# Patient Record
Sex: Female | Born: 1965 | Race: White | Hispanic: No | Marital: Single
Health system: Southern US, Community
[De-identification: ages and names within clinical notes are randomized; demographics above are authoritative.]

---

## 2019-06-11 ENCOUNTER — Other Ambulatory Visit: Payer: Self-pay | Admitting: Family Medicine

## 2019-06-11 DIAGNOSIS — R928 Other abnormal and inconclusive findings on diagnostic imaging of breast: Secondary | ICD-10-CM

## 2019-06-12 ENCOUNTER — Ambulatory Visit
Admission: RE | Admit: 2019-06-12 | Discharge: 2019-06-12 | Disposition: A | Discharge status: Home / Self Care | Payer: 59 | Source: Ambulatory Visit | Attending: Family Medicine | Admitting: Family Medicine

## 2019-06-12 ENCOUNTER — Encounter: Payer: Self-pay | Admitting: Radiology

## 2019-06-12 DIAGNOSIS — R928 Other abnormal and inconclusive findings on diagnostic imaging of breast: Secondary | ICD-10-CM | POA: Insufficient documentation

## 2019-06-17 NOTE — Progress Notes (Signed)
Per Christianne Dolin FNP, patient will be followed at [facility].  States patient insurance changing, and [employer].  She has an appointment on 06/21/2019.  Provider very grateful for continuity of care patient has received here.

## 2019-06-19 LAB — SURGICAL PATHOLOGY

## 2020-04-20 IMAGING — MG US  BREAST BX W/ LOC DEV 1ST LESION IMG BX SPEC US GUIDE*R*
1 series · 8 of 8 positions shown · non-contrast
Comparison: Previous exam(s).
COMPARISON: Previous exam(s).

Addendum:
CLINICAL DATA: Patient with a RIGHT breast mass presents today for
ultrasound-guided core biopsy.

EXAM:
ULTRASOUND GUIDED RIGHT BREAST CORE NEEDLE BIOPSY

[Series 1: MG view · 0.07mm/px · 8 of 12 slices shown]
[im 1/12]
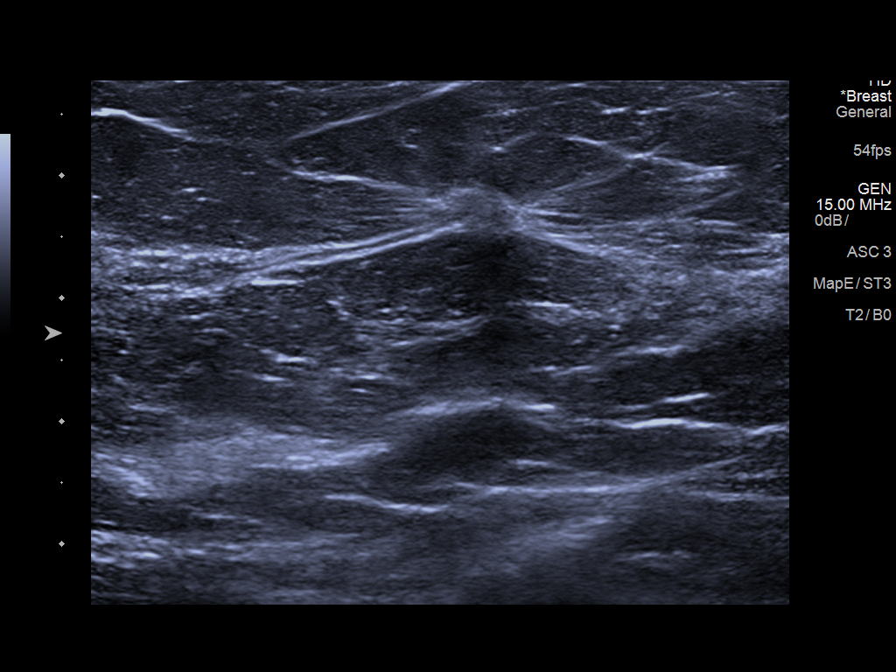
[im 2/12]
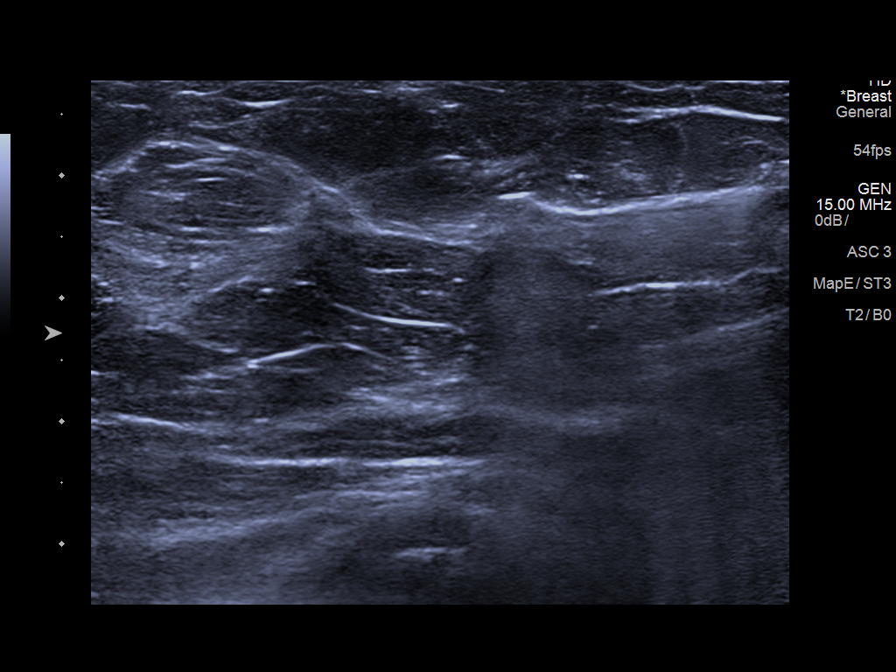
[im 4/12]
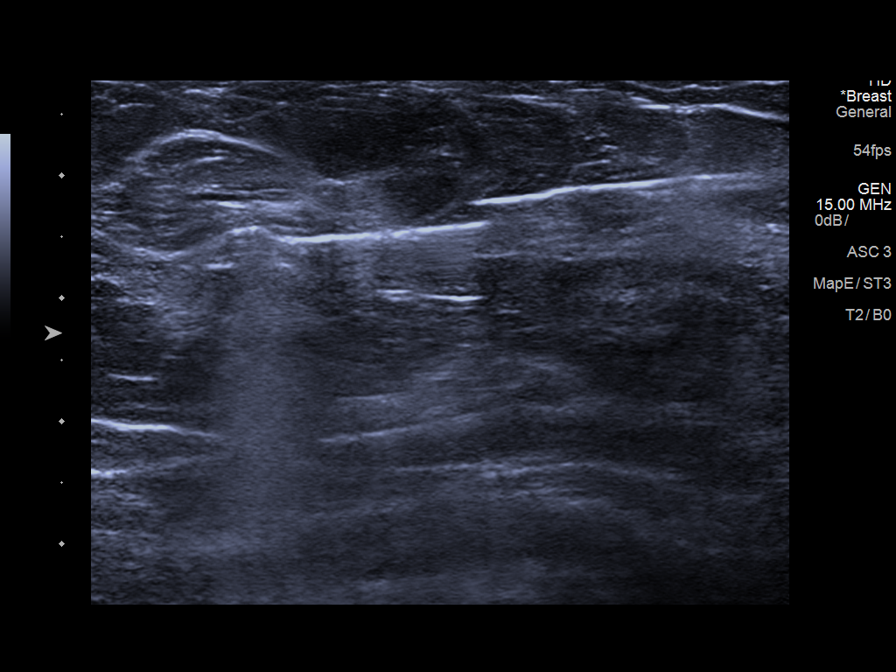
[im 5/12]
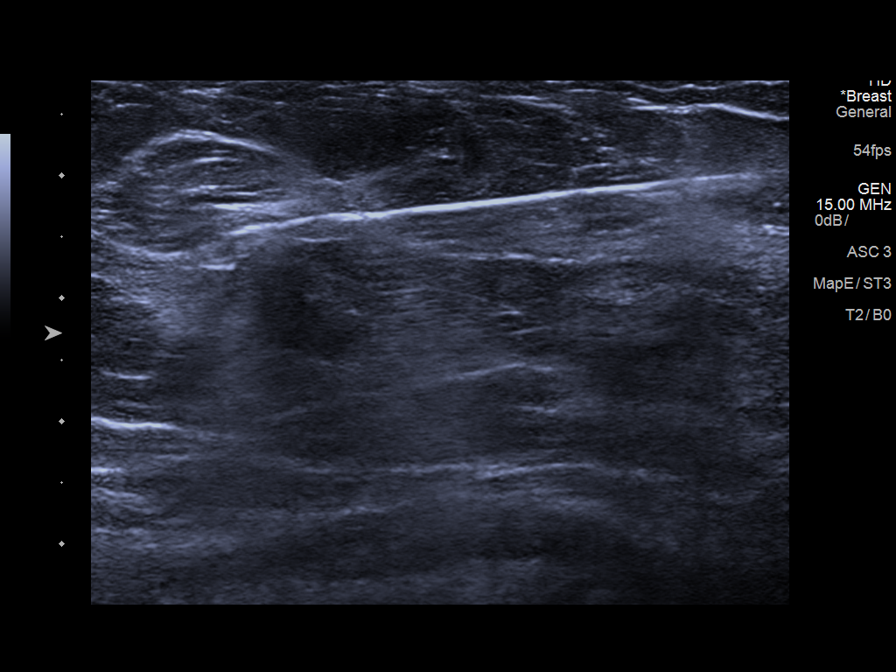
[im 7/12]
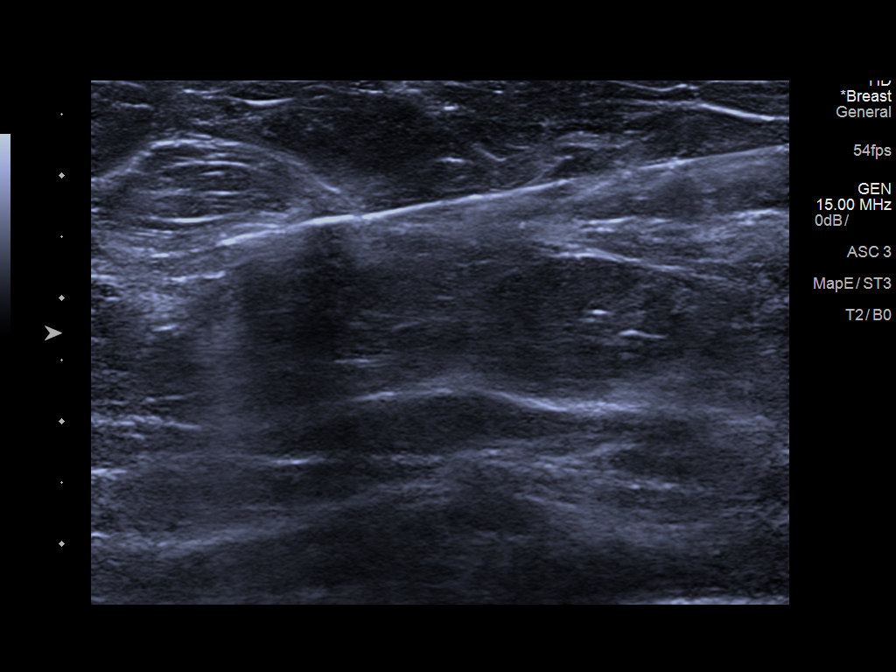
[im 8/12]
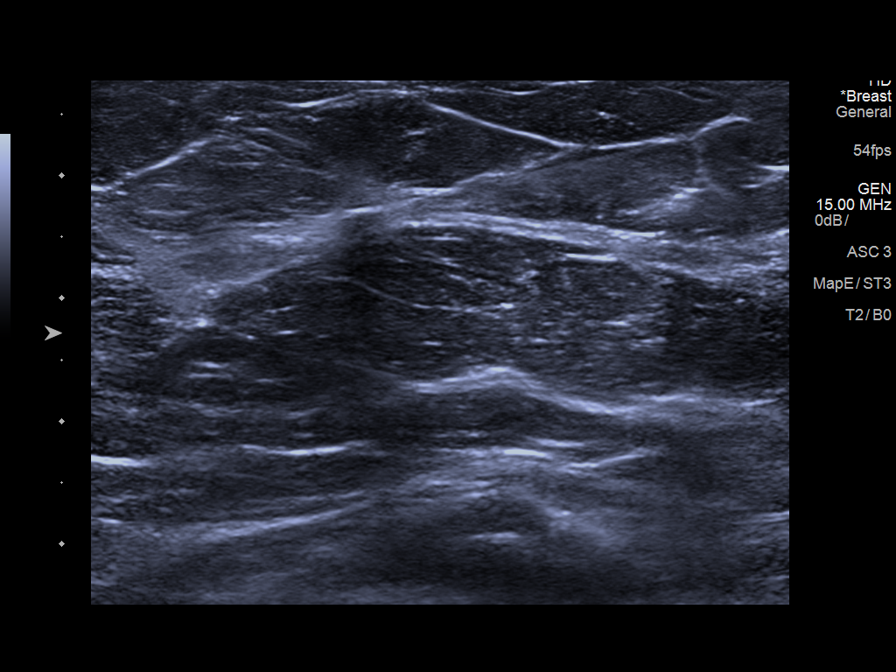
[im 10/12]
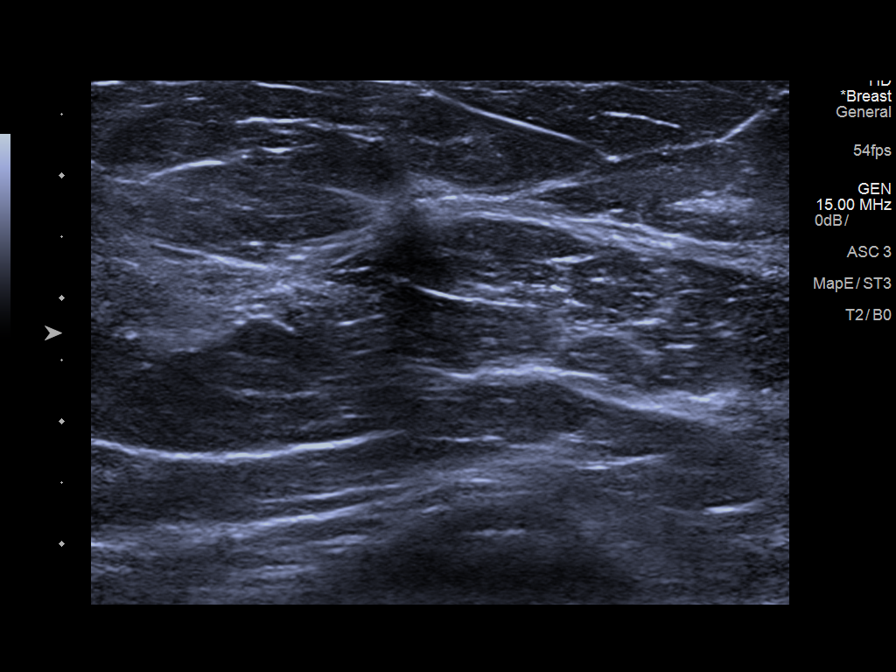
[im 12/12]
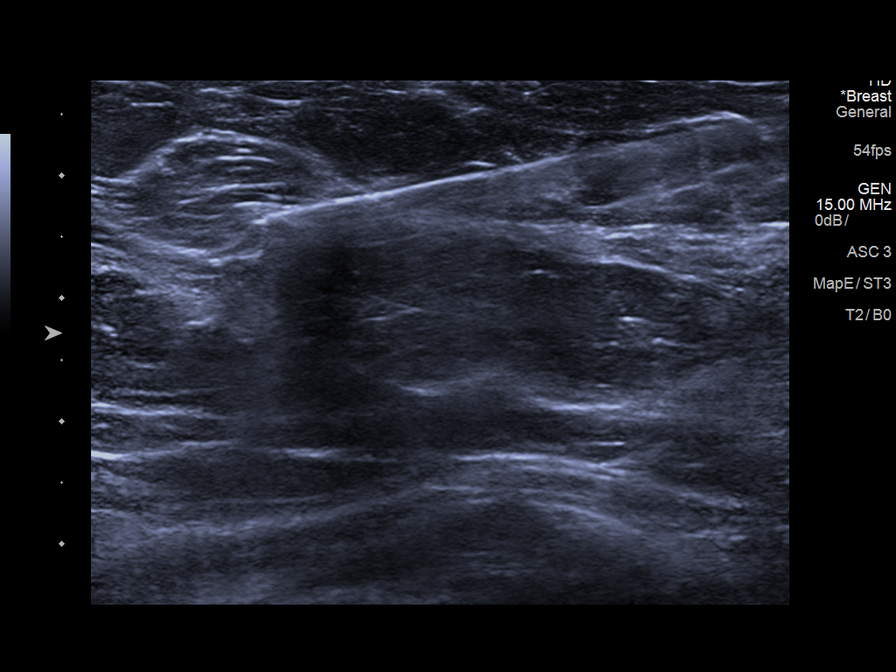

[8 of 8 positions shown; findings below may reference images not displayed]



Lesion quadrant: Upper outer quadrant

Using sterile technique and 1% Lidocaine as local anesthetic, under
direct ultrasound visualization, a 12 gauge Ramalabasov device was
used to perform biopsy of the RIGHT breast mass at the 10 o'clock
axis using a lateral approach. At the conclusion of the procedure
venous shaped tissue marker clip was deployed into the biopsy
cavity. Follow up 2 view mammogram was performed and dictated
separately.
IMPRESSION: Ultrasound guided biopsy of the RIGHT breast mass at the 10 o'clock
axis. No apparent complications.

ADDENDUM:
PATHOLOGY revealed: A. BREAST, RIGHT, [DATE] - INVASIVE MAMMARY
CARCINOMA, NOS, ductal. 6 mm in this sample. Grade 1. Ductal
carcinoma in situ: Not identified. Lymphovascular invasion: Not
identified.

Pathology results are CONCORDANT with imaging findings, per Dr. Sini-Tuuli
Callista.

Pathology results were discussed with patient via telephone. The
patient reported doing well after the biopsy with no adverse
symptoms, only tenderness at the site. Post biopsy care instructions
were reviewed and questions were answered. The patient was
encouraged to call [HOSPITAL] for any additional
questions or concerns. I informed patient her biopsy results will be
sent to Mawladad Salaar NP, and she will contact patient to set up
surgeon appointment.

Recommendation: Surgical referral. Request for surgical referral was
relayed to [place], at Queena Jumper NP office. Queena Jumper NP
(Dr. [[email or web address]]) will discuss surgical referral options with
patient and arrange surgical referral appointment for patient.

Addendum by Tomo RN on 06/14/2019.



Lesion quadrant: Upper outer quadrant

Using sterile technique and 1% Lidocaine as local anesthetic, under
direct ultrasound visualization, a 12 gauge Saki Hinostroza device was
used to perform biopsy of the RIGHT breast mass at the 10 o'clock
axis using a lateral approach. At the conclusion of the procedure
venous shaped tissue marker clip was deployed into the biopsy
cavity. Follow up 2 view mammogram was performed and dictated
separately.
IMPRESSION: Ultrasound guided biopsy of the RIGHT breast mass at the 10 o'clock
axis. No apparent complications.

## 2020-04-20 IMAGING — MG MM BREAST LOCALIZATION CLIP
4 series · 4 of 12 positions shown · non-contrast
Comparison: Previous exam(s).

CLINICAL DATA: Status post ultrasound-guided biopsy of a RIGHT
breast mass at the 10 o'clock axis.

EXAM:
DIAGNOSTIC RIGHT MAMMOGRAM POST ULTRASOUND BIOPSY

[R CC synth-2D]
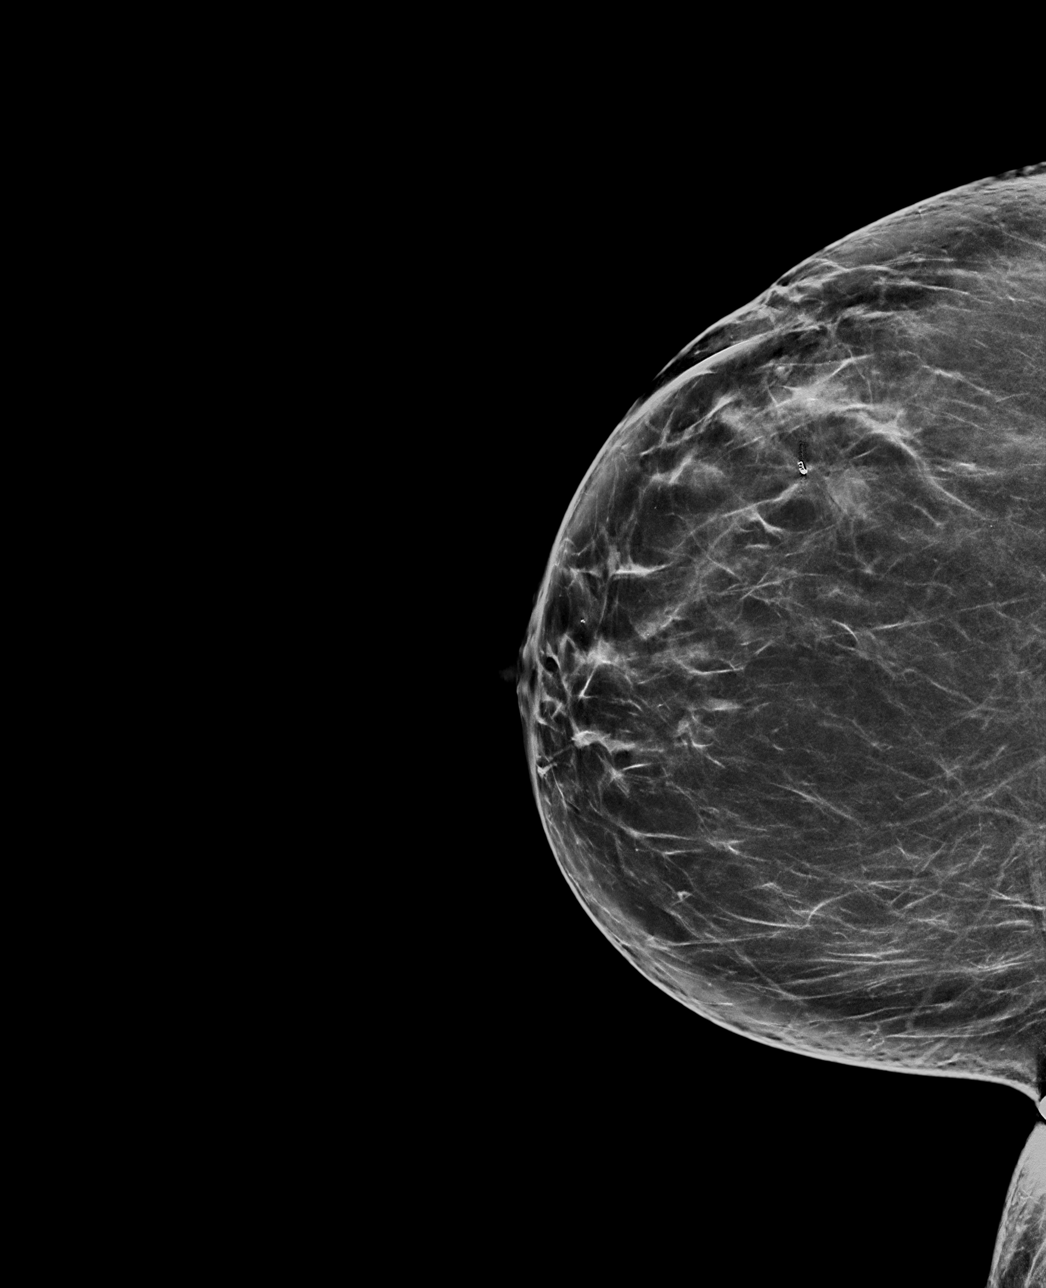

[R ML synth-2D]
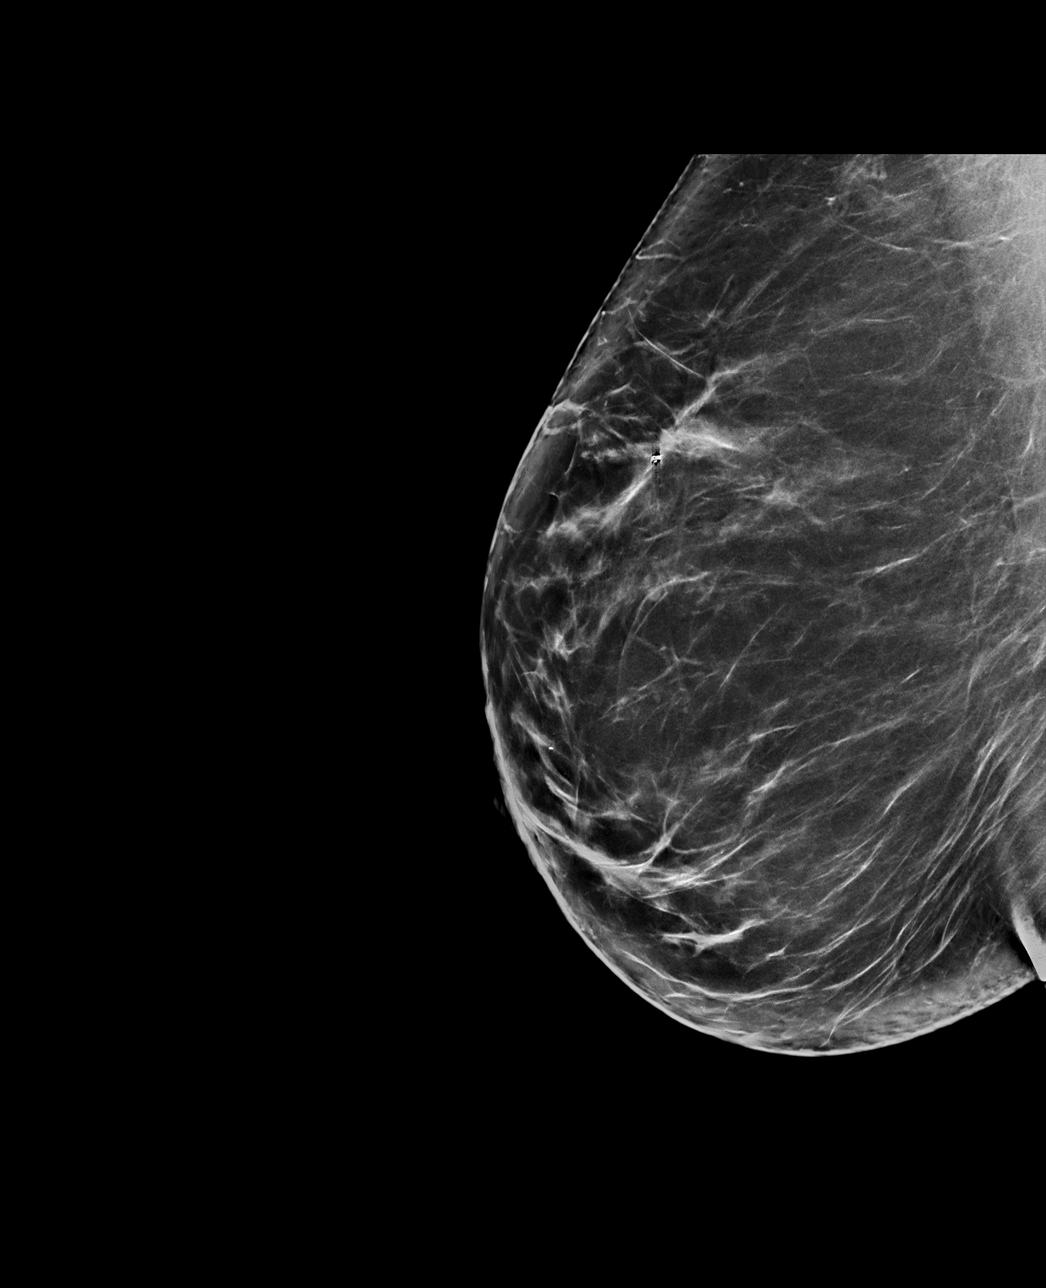

[R CC tomo · tomo slice 41/80.0]
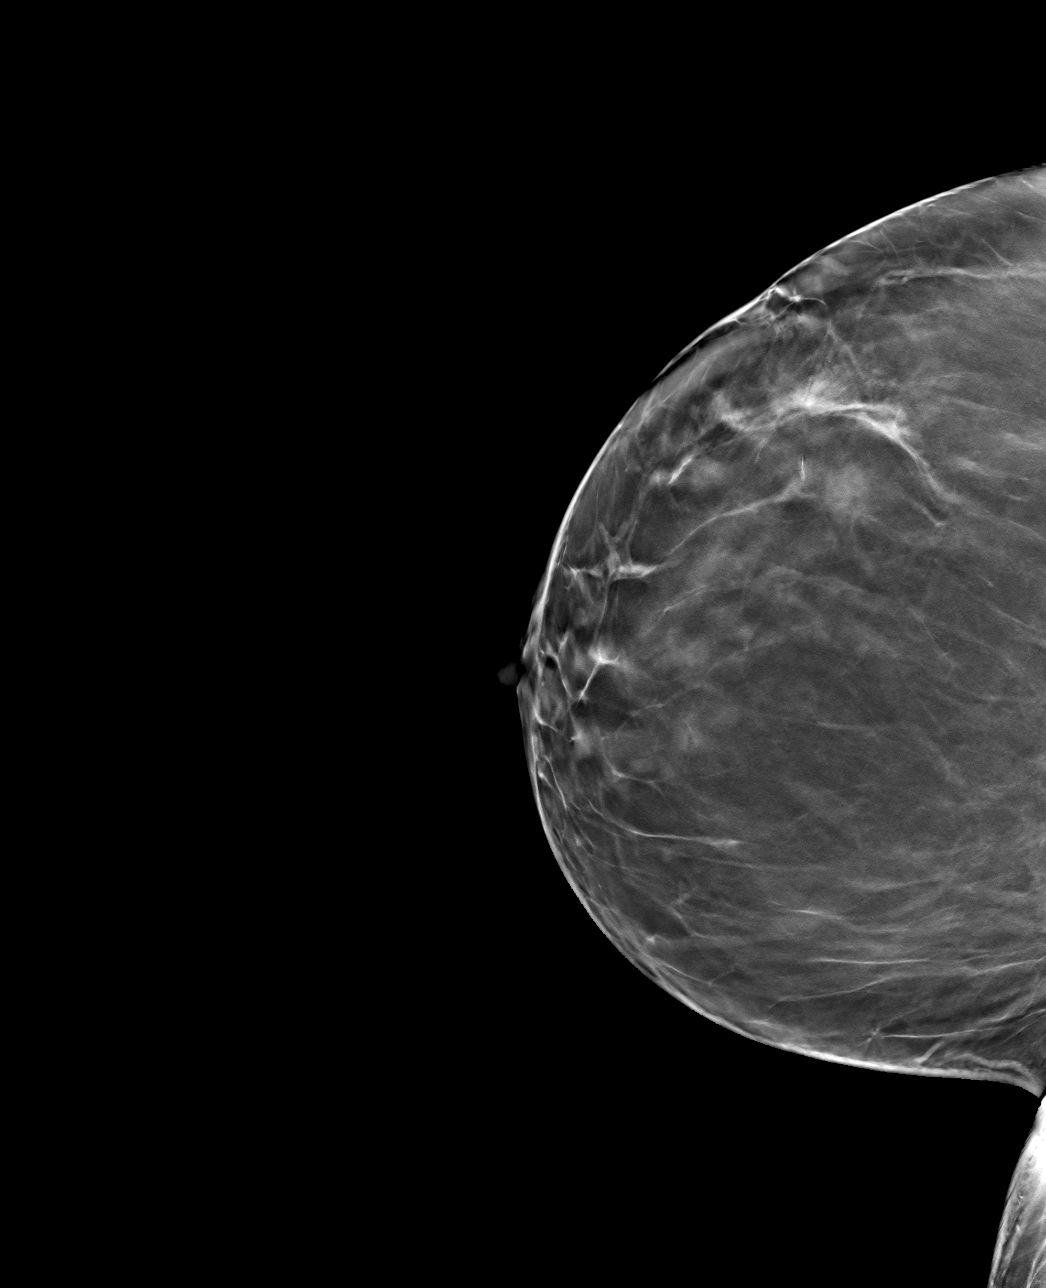

[R ML tomo · tomo slice 44/87.0]
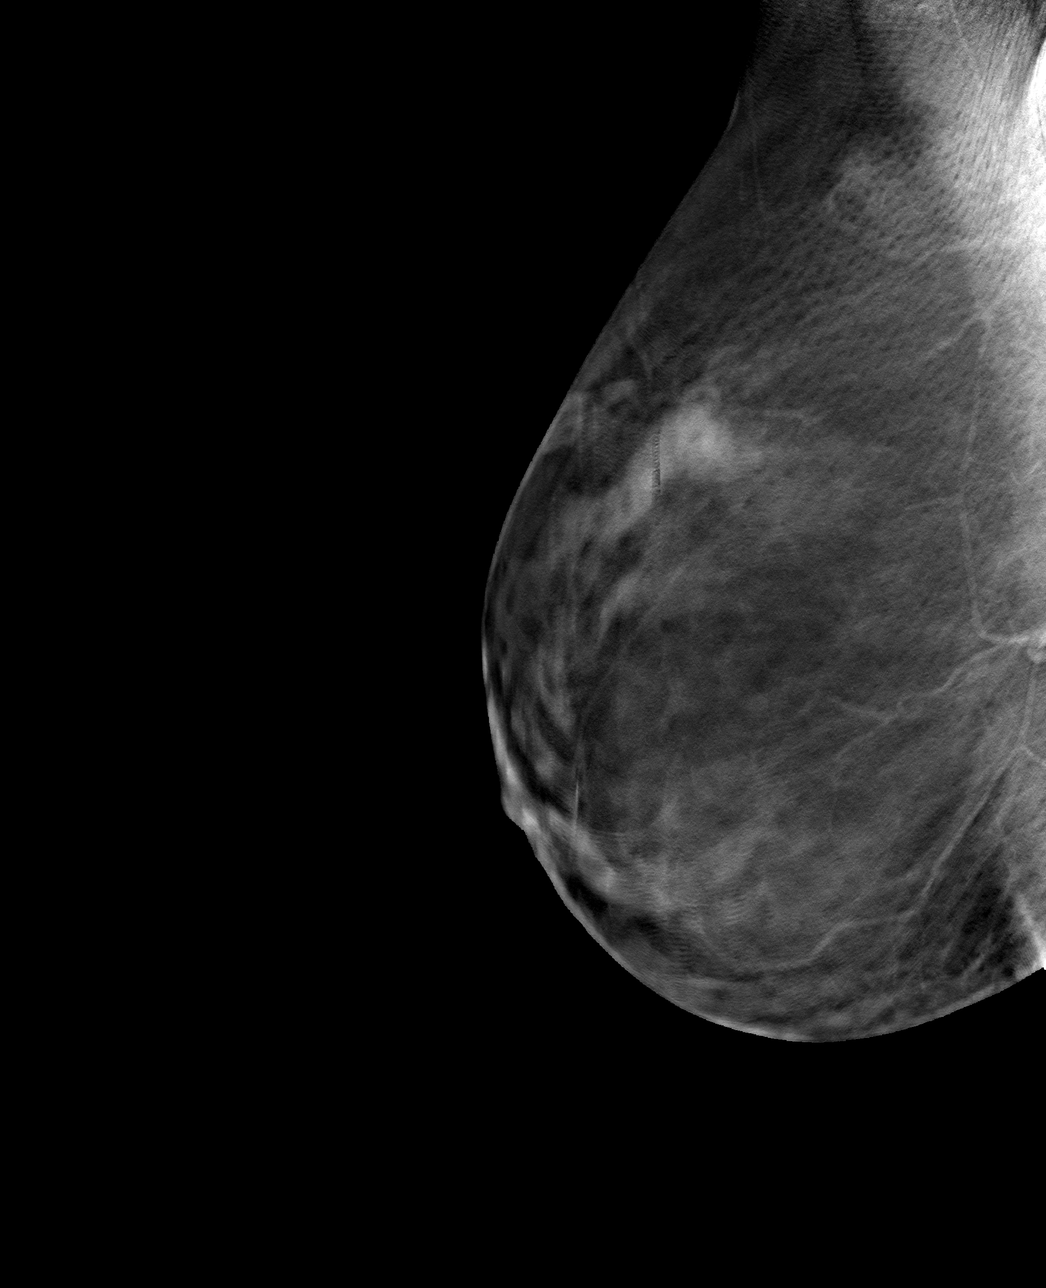

[4 of 12 positions shown; findings below may reference images not displayed]

FINDINGS: Mammographic images were obtained following ultrasound guided biopsy
of the RIGHT breast mass at the 10 o'clock axis. The biopsy marking
clip is in expected position at the site of biopsy.
IMPRESSION: Appropriate positioning of the venous shaped biopsy marking clip at
the site of biopsy in the upper-outer quadrant of the RIGHT breast
corresponding to the targeted 5 mm mass at the 10 o'clock axis.

Final Assessment: Post Procedure Mammograms for Marker Placement
